# Patient Record
Sex: Male | Born: 2005 | Race: Black or African American | Hispanic: No | Marital: Single | Smoking: Never smoker
Health system: Southern US, Community
[De-identification: ages and names within clinical notes are randomized; demographics above are authoritative.]

## PROBLEM LIST (undated history)

## (undated) HISTORY — PX: NO PAST SURGERIES: SHX2092

---
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
## 2024-01-03 ENCOUNTER — Emergency Department (HOSPITAL_COMMUNITY)

## 2024-01-03 ENCOUNTER — Encounter (HOSPITAL_COMMUNITY): Payer: Self-pay | Admitting: Emergency Medicine

## 2024-01-03 ENCOUNTER — Emergency Department (HOSPITAL_COMMUNITY)
Admission: EM | Admit: 2024-01-03 | Discharge: 2024-01-04 | Disposition: A | Discharge status: Home / Self Care | Attending: Emergency Medicine | Admitting: Emergency Medicine

## 2024-01-03 DIAGNOSIS — K429 Umbilical hernia without obstruction or gangrene: Secondary | ICD-10-CM | POA: Insufficient documentation

## 2024-01-03 DIAGNOSIS — Y92410 Unspecified street and highway as the place of occurrence of the external cause: Secondary | ICD-10-CM | POA: Insufficient documentation

## 2024-01-03 DIAGNOSIS — S62633A Displaced fracture of distal phalanx of left middle finger, initial encounter for closed fracture: Secondary | ICD-10-CM | POA: Insufficient documentation

## 2024-01-03 DIAGNOSIS — Z23 Encounter for immunization: Secondary | ICD-10-CM | POA: Diagnosis not present

## 2024-01-03 DIAGNOSIS — R930 Abnormal findings on diagnostic imaging of skull and head, not elsewhere classified: Secondary | ICD-10-CM | POA: Insufficient documentation

## 2024-01-03 DIAGNOSIS — S6992XA Unspecified injury of left wrist, hand and finger(s), initial encounter: Secondary | ICD-10-CM | POA: Diagnosis present

## 2024-01-03 DIAGNOSIS — S50311A Abrasion of right elbow, initial encounter: Secondary | ICD-10-CM | POA: Diagnosis not present

## 2024-01-03 LAB — COMPREHENSIVE METABOLIC PANEL WITH GFR
ALT: 18 U/L (ref 0–44)
AST: 26 U/L (ref 15–41)
Albumin: 4 g/dL (ref 3.5–5.0)
Alkaline Phosphatase: 48 U/L (ref 38–126)
Anion gap: 12 (ref 5–15)
BUN: 8 mg/dL (ref 6–20)
CO2: 22 mmol/L (ref 22–32)
Calcium: 8.8 mg/dL — ABNORMAL LOW (ref 8.9–10.3)
Chloride: 105 mmol/L (ref 98–111)
Creatinine, Ser: 1.1 mg/dL (ref 0.61–1.24)
GFR, Estimated: >60 mL/min (ref >60)
Glucose, Bld: 111 mg/dL — ABNORMAL HIGH (ref 70–99)
Potassium: 3.3 mmol/L — ABNORMAL LOW (ref 3.5–5.1)
Sodium: 139 mmol/L (ref 135–145)
Total Bilirubin: 2.6 mg/dL — ABNORMAL HIGH (ref 0.0–1.2)
Total Protein: 6.7 g/dL (ref 6.5–8.1)

## 2024-01-03 LAB — PROTIME-INR
INR: 1.1 (ref 0.8–1.2)
Prothrombin Time: 14.7 s (ref 11.4–15.2)

## 2024-01-03 LAB — I-STAT CHEM 8, ED
BUN: 7 mg/dL (ref 6–20)
Calcium, Ion: 1.11 mmol/L — ABNORMAL LOW (ref 1.15–1.40)
Chloride: 104 mmol/L (ref 98–111)
Creatinine, Ser: 1.1 mg/dL (ref 0.61–1.24)
Glucose, Bld: 112 mg/dL — ABNORMAL HIGH (ref 70–99)
HCT: 39 % (ref 39.0–52.0)
Hemoglobin: 13.3 g/dL (ref 13.0–17.0)
Potassium: 3.3 mmol/L — ABNORMAL LOW (ref 3.5–5.1)
Sodium: 142 mmol/L (ref 135–145)
TCO2: 22 mmol/L (ref 22–32)

## 2024-01-03 LAB — CBC WITH DIFFERENTIAL/PLATELET
Abs Immature Granulocytes: 0.03 10*3/uL (ref 0.00–0.07)
Basophils Absolute: 0.1 10*3/uL (ref 0.0–0.1)
Basophils Relative: 1 %
Eosinophils Absolute: 0 10*3/uL (ref 0.0–0.5)
Eosinophils Relative: 0 %
HCT: 39.8 % (ref 39.0–52.0)
Hemoglobin: 13.7 g/dL (ref 13.0–17.0)
Immature Granulocytes: 0 %
Lymphocytes Relative: 38 %
Lymphs Abs: 3.2 10*3/uL (ref 0.7–4.0)
MCH: 31.6 pg (ref 26.0–34.0)
MCHC: 34.4 g/dL (ref 30.0–36.0)
MCV: 91.7 fL (ref 80.0–100.0)
Monocytes Absolute: 0.5 10*3/uL (ref 0.1–1.0)
Monocytes Relative: 6 %
Neutro Abs: 4.6 10*3/uL (ref 1.7–7.7)
Neutrophils Relative %: 55 %
Platelets: 218 10*3/uL (ref 150–400)
RBC: 4.34 MIL/uL (ref 4.22–5.81)
RDW: 11.7 % (ref 11.5–15.5)
WBC: 8.4 10*3/uL (ref 4.0–10.5)
nRBC: 0 % (ref 0.0–0.2)

## 2024-01-03 LAB — SAMPLE TO BLOOD BANK

## 2024-01-03 LAB — ETHANOL: Alcohol, Ethyl (B): <15 mg/dL (ref <15)

## 2024-01-03 LAB — I-STAT CG4 LACTIC ACID, ED: Lactic Acid, Venous: 2.9 mmol/L (ref 0.5–1.9)

## 2024-01-03 MED ORDER — KETOROLAC TROMETHAMINE 15 MG/ML IJ SOLN
15.0000 mg | Freq: Once | INTRAMUSCULAR | Status: AC
  Administered 2024-01-03: 15 mg via INTRAVENOUS
  Filled 2024-01-03: qty 1

## 2024-01-03 MED ORDER — MORPHINE SULFATE (PF) 4 MG/ML IV SOLN: 4.0000 mg | Freq: Once | INTRAVENOUS | Status: DC

## 2024-01-03 MED ORDER — LACTATED RINGERS IV BOLUS
1000.0000 mL | Freq: Once | INTRAVENOUS | Status: AC
  Administered 2024-01-03: 1000 mL via INTRAVENOUS

## 2024-01-03 MED ORDER — MORPHINE SULFATE (PF) 4 MG/ML IV SOLN
4.0000 mg | Freq: Once | INTRAVENOUS | Status: AC
  Administered 2024-01-03: 4 mg via INTRAVENOUS
  Filled 2024-01-03: qty 1

## 2024-01-03 MED ORDER — IOHEXOL 350 MG/ML SOLN: 75.0000 mL | Freq: Once | INTRAVENOUS | Status: DC | PRN

## 2024-01-03 MED ORDER — IOHEXOL 350 MG/ML SOLN
75.0000 mL | Freq: Once | INTRAVENOUS | Status: AC | PRN
  Administered 2024-01-03: 75 mL via INTRAVENOUS

## 2024-01-03 MED ORDER — TETANUS-DIPHTH-ACELL PERTUSSIS 5-2.5-18.5 LF-MCG/0.5 IM SUSY
0.5000 mL | PREFILLED_SYRINGE | Freq: Once | INTRAMUSCULAR | Status: AC
  Administered 2024-01-03: 0.5 mL via INTRAMUSCULAR

## 2024-01-03 MED ORDER — CEFAZOLIN SODIUM-DEXTROSE 2-4 GM/100ML-% IV SOLN
2.0000 g | Freq: Once | INTRAVENOUS | Status: AC
  Administered 2024-01-03: 2 g via INTRAVENOUS
  Filled 2024-01-03: qty 100

## 2024-01-03 MED ORDER — CEPHALEXIN 500 MG PO CAPS: 500.0000 mg | ORAL_CAPSULE | Freq: Two times a day (BID) | ORAL | 0 refills | Status: AC

## 2024-01-03 NOTE — Discharge Instructions (Addendum)
 Please call hand surgery clinic tomorrow to schedule an appointment with Dr. Gildardo Alderton. Please keep splint clean and dry. Please wear fracture shoe to assist with toe pain. DO NOT BEAR WEIGHT ON YOUR LEFT HAND. Please take Keflex 2 times a day for the next 7 days Please take tylenol  500mg  every 4 hours. You make take 1O00mg  at a time but DO NOT TAKE more than 4000mg  in a 24hr period. Please take 400mg  Ibuprofen every 4 hours You may take Norco every 4-6 hours as needed for severe pain.  Please note that Norco has 325 mg of Tylenol  in it. Thank you for allowing us  to take care of you today.  We hope you begin feeling better soon.   To-Do:  Please follow-up with your primary doctor within the next 2-3 days. Please return to the Emergency Department or call 911 if you experience chest pain, shortness of breath, severe pain, severe fever, altered mental status, or have any reason to think that you need emergency medical care.  Thank you again.  Hope you feel better soon.  [facility] of Emergency Medicine
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
## 2024-01-03 NOTE — ED Notes (Signed)
 Trauma activated as a level 2
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
## 2024-01-03 NOTE — ED Provider Notes (Signed)
 ATTENDING SUPERVISORY NOTE I have personally viewed the imaging studies performed. I have personally seen and examined the patient, and discussed the plan of care with the resident.  I have reviewed the documentation of the resident and agree.  No diagnosis found.  Ultrasound ED FAST  Date/Time: 01/03/2024 8:18 PM  Performed by: Tonia Chew, MD Authorized by: Tonia Chew, MD  Procedure details:    Indications: blunt abdominal trauma and blunt chest trauma       Assess for:  Intra-abdominal fluid and pericardial effusion    Technique:  Abdominal and cardiac    Images: archived      Abdominal findings:    L kidney:  Visualized   R kidney:  Visualized   Liver:  Visualized    Bladder:  Visualized   Hepatorenal space visualized: identified     Splenorenal space: identified     Rectovesical free fluid: not identified     Splenorenal free fluid: not identified     Hepatorenal space free fluid: not identified   Cardiac findings:    Heart:  Visualized   Wall motion: identified     Pericardial effusion: not identified       Tonia Chew, MD 01/05/24 [phone]

## 2024-01-04 MED ORDER — HYDROCODONE-ACETAMINOPHEN 5-325 MG PO TABS: 2.0000 | ORAL_TABLET | Freq: Four times a day (QID) | ORAL | 0 refills | Status: DC | PRN

## 2024-01-04 NOTE — ED Provider Notes (Signed)
  Physical Exam  BP 122/78   Pulse 65   Temp 98 F (36.7 C) (Oral)   Resp 19   SpO2 100%  Not applicable  Procedures  Procedures  ED Course / MDM   Clinical Course as of 01/04/24 0102  Mon Jan 03, 2024  2108 1. Mildly displaced angulated fracture of the head of the fifth metacarpal. 2. Comminuted displaced fracture of the distal third digit.   [TS]  2108 1. Fractures of the first, second, and fourth digits of the left foot. 2. Avulsion fracture of the posterior process of the talus.   [TS]  Tue Jan 04, 2024  0032 Ulnar gutter, cannot bear weight on L hand, needs reevaluation after splint  [CB]    Clinical Course User Index [CB] Beola Terrall RAMAN, PA-C [TS] Sherrine Birmingham, DO   Medical Decision Making Amount and/or Complexity of Data Reviewed Labs: ordered. Radiology: ordered.  Risk Prescription drug management.   Patient care transferred over from Dr. Birmingham Sherrine.  Awaiting reevaluation after ulnar gutter splint.  However before being able to evaluate or see the patient, patient was discharged.  Unable to evaluate patient due to having been discharged prior to my evaluation.       Beola Terrall RAMAN, PA-C 01/04/24 0103    Jerrol Agent, MD 01/04/24 0200

## 2024-01-04 NOTE — Progress Notes (Signed)
 Orthopedic Tech Progress Note Patient Details:  CHASYN CINQUE 07-28-2005 [id number]  Ortho Devices Type of Ortho Device: Finger splint, Ulna gutter splint, Postop shoe/boot, Crutches Ortho Device/Splint Location: lue, lle Ortho Device/Splint Interventions: Ordered, Application, Adjustment  I spoke with the dr before seeing the pt to get permission to apply a finger splint to the lue 3rd digit and applying the ulna gutter to that finger would put pressure on the wound. They said it would be ok. I applied an lue ulna gutter and finger splint to the 3rd digit, lle post op shoe and crutches. Post Interventions Patient Tolerated: Well Instructions Provided: Care of device, Adjustment of device  Chandra Dorn PARAS 01/04/2024, 1:31 AM

## 2024-01-11 ENCOUNTER — Other Ambulatory Visit (INDEPENDENT_AMBULATORY_CARE_PROVIDER_SITE_OTHER): Payer: Self-pay

## 2024-01-11 ENCOUNTER — Ambulatory Visit (INDEPENDENT_AMBULATORY_CARE_PROVIDER_SITE_OTHER): Payer: Self-pay | Admitting: Orthopedic Surgery

## 2024-01-11 ENCOUNTER — Encounter: Payer: Self-pay | Admitting: Physician Assistant

## 2024-01-11 DIAGNOSIS — S6292XA Unspecified fracture of left wrist and hand, initial encounter for closed fracture: Secondary | ICD-10-CM | POA: Insufficient documentation
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
## 2024-01-12 ENCOUNTER — Encounter (HOSPITAL_BASED_OUTPATIENT_CLINIC_OR_DEPARTMENT_OTHER): Payer: Self-pay | Admitting: Orthopedic Surgery

## 2024-01-12 ENCOUNTER — Other Ambulatory Visit: Payer: Self-pay
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
## 2024-01-13 ENCOUNTER — Telehealth: Payer: Self-pay

## 2024-01-13 ENCOUNTER — Encounter (HOSPITAL_BASED_OUTPATIENT_CLINIC_OR_DEPARTMENT_OTHER): Payer: Self-pay | Admitting: Orthopedic Surgery

## 2024-01-13 ENCOUNTER — Ambulatory Visit (HOSPITAL_BASED_OUTPATIENT_CLINIC_OR_DEPARTMENT_OTHER)
Admission: RE | Admit: 2024-01-13 | Discharge: 2024-01-13 | Disposition: A | Discharge status: Home / Self Care | Payer: Self-pay | Attending: Orthopedic Surgery | Admitting: Orthopedic Surgery

## 2024-01-13 ENCOUNTER — Ambulatory Visit (HOSPITAL_BASED_OUTPATIENT_CLINIC_OR_DEPARTMENT_OTHER): Payer: Self-pay | Admitting: Anesthesiology

## 2024-01-13 ENCOUNTER — Encounter (HOSPITAL_BASED_OUTPATIENT_CLINIC_OR_DEPARTMENT_OTHER): Payer: Self-pay | Admitting: Anesthesiology

## 2024-01-13 ENCOUNTER — Encounter (HOSPITAL_BASED_OUTPATIENT_CLINIC_OR_DEPARTMENT_OTHER)
Admission: RE | Disposition: A | Discharge status: Home / Self Care | Payer: Self-pay | Source: Home / Self Care | Attending: Orthopedic Surgery

## 2024-01-13 ENCOUNTER — Ambulatory Visit (HOSPITAL_BASED_OUTPATIENT_CLINIC_OR_DEPARTMENT_OTHER): Payer: Self-pay

## 2024-01-13 DIAGNOSIS — F1290 Cannabis use, unspecified, uncomplicated: Secondary | ICD-10-CM | POA: Insufficient documentation

## 2024-01-13 DIAGNOSIS — S62633A Displaced fracture of distal phalanx of left middle finger, initial encounter for closed fracture: Secondary | ICD-10-CM | POA: Insufficient documentation

## 2024-01-13 DIAGNOSIS — S62633B Displaced fracture of distal phalanx of left middle finger, initial encounter for open fracture: Secondary | ICD-10-CM

## 2024-01-13 DIAGNOSIS — S62337A Displaced fracture of neck of fifth metacarpal bone, left hand, initial encounter for closed fracture: Secondary | ICD-10-CM | POA: Insufficient documentation

## 2024-01-13 DIAGNOSIS — Z87891 Personal history of nicotine dependence: Secondary | ICD-10-CM | POA: Insufficient documentation

## 2024-01-13 HISTORY — PX: OPEN REDUCTION INTERNAL FIXATION (ORIF) DISTAL PHALANX: SHX6236

## 2024-01-13 SURGERY — OPEN REDUCTION INTERNAL FIXATION (ORIF) DISTAL PHALANX
Anesthesia: Monitor Anesthesia Care | Site: Hand | Laterality: Left

## 2024-01-13 MED ORDER — KETOROLAC TROMETHAMINE 30 MG/ML IJ SOLN
INTRAMUSCULAR | Status: DC | PRN
  Administered 2024-01-13: 30 mg via INTRAVENOUS

## 2024-01-13 MED ORDER — KETOROLAC TROMETHAMINE 30 MG/ML IJ SOLN
INTRAMUSCULAR | Status: AC
  Filled 2024-01-13: qty 3

## 2024-01-13 MED ORDER — FENTANYL CITRATE (PF) 100 MCG/2ML IJ SOLN: 25.0000 ug | INTRAMUSCULAR | Status: DC | PRN

## 2024-01-13 MED ORDER — CEFAZOLIN SODIUM-DEXTROSE 2-4 GM/100ML-% IV SOLN
INTRAVENOUS | Status: AC
  Filled 2024-01-13: qty 100

## 2024-01-13 MED ORDER — OXYCODONE HCL 5 MG/5ML PO SOLN: 5.0000 mg | Freq: Once | ORAL | Status: AC | PRN

## 2024-01-13 MED ORDER — LIDOCAINE-EPINEPHRINE 1 %-1:100000 IJ SOLN
INTRAMUSCULAR | Status: AC
  Filled 2024-01-13: qty 1

## 2024-01-13 MED ORDER — MIDAZOLAM HCL 2 MG/2ML IJ SOLN
INTRAMUSCULAR | Status: DC | PRN
  Administered 2024-01-13: 2 mg via INTRAVENOUS

## 2024-01-13 MED ORDER — FENTANYL CITRATE (PF) 100 MCG/2ML IJ SOLN
INTRAMUSCULAR | Status: DC | PRN
  Administered 2024-01-13 (×2): 25 ug via INTRAVENOUS
  Administered 2024-01-13: 50 ug via INTRAVENOUS

## 2024-01-13 MED ORDER — LIDOCAINE HCL (PF) 1 % IJ SOLN
INTRAMUSCULAR | Status: AC
  Filled 2024-01-13: qty 5

## 2024-01-13 MED ORDER — LACTATED RINGERS IV SOLN: INTRAVENOUS | Status: DC

## 2024-01-13 MED ORDER — DEXMEDETOMIDINE HCL IN NACL 80 MCG/20ML IV SOLN
INTRAVENOUS | Status: DC | PRN
  Administered 2024-01-13 (×4): 4 ug via INTRAVENOUS

## 2024-01-13 MED ORDER — DROPERIDOL 2.5 MG/ML IJ SOLN: 0.6250 mg | Freq: Once | INTRAMUSCULAR | Status: DC | PRN

## 2024-01-13 MED ORDER — CEFAZOLIN SODIUM-DEXTROSE 2-4 GM/100ML-% IV SOLN
2.0000 g | INTRAVENOUS | Status: AC
  Administered 2024-01-13: 2 g via INTRAVENOUS

## 2024-01-13 MED ORDER — ONDANSETRON HCL 4 MG/2ML IJ SOLN
INTRAMUSCULAR | Status: DC | PRN
  Administered 2024-01-13: 4 mg via INTRAVENOUS

## 2024-01-13 MED ORDER — MIDAZOLAM HCL 2 MG/2ML IJ SOLN
INTRAMUSCULAR | Status: AC
  Filled 2024-01-13: qty 2

## 2024-01-13 MED ORDER — ACETAMINOPHEN 500 MG PO TABS
ORAL_TABLET | ORAL | Status: AC
  Filled 2024-01-13: qty 2

## 2024-01-13 MED ORDER — 0.9 % SODIUM CHLORIDE (POUR BTL) OPTIME
TOPICAL | Status: DC | PRN
  Administered 2024-01-13: 2000 mL

## 2024-01-13 MED ORDER — PROPOFOL 500 MG/50ML IV EMUL
INTRAVENOUS | Status: DC | PRN
Start: 2024-01-13 — End: 2024-01-13
  Administered 2024-01-13: 150 ug/kg/min via INTRAVENOUS

## 2024-01-13 MED ORDER — ROPIVACAINE HCL 5 MG/ML IJ SOLN
INTRAMUSCULAR | Status: AC
  Filled 2024-01-13: qty 30

## 2024-01-13 MED ORDER — ACETAMINOPHEN 500 MG PO TABS
1000.0000 mg | ORAL_TABLET | Freq: Once | ORAL | Status: AC
  Administered 2024-01-13: 1000 mg via ORAL

## 2024-01-13 MED ORDER — FENTANYL CITRATE (PF) 100 MCG/2ML IJ SOLN
INTRAMUSCULAR | Status: AC
  Filled 2024-01-13: qty 2

## 2024-01-13 MED ORDER — LIDOCAINE HCL 1 % IJ SOLN
INTRAMUSCULAR | Status: DC | PRN
Start: 2024-01-13 — End: 2024-01-13
  Administered 2024-01-13: 10 mL

## 2024-01-13 MED ORDER — LIDOCAINE HCL (CARDIAC) PF 100 MG/5ML IV SOSY
PREFILLED_SYRINGE | INTRAVENOUS | Status: DC | PRN
  Administered 2024-01-13: 40 mg via INTRAVENOUS

## 2024-01-13 MED ORDER — OXYCODONE HCL 5 MG PO TABS
5.0000 mg | ORAL_TABLET | Freq: Once | ORAL | Status: AC | PRN
  Administered 2024-01-13: 5 mg via ORAL

## 2024-01-13 MED ORDER — OXYCODONE HCL 5 MG PO TABS: 5.0000 mg | ORAL_TABLET | Freq: Four times a day (QID) | ORAL | 0 refills | Status: AC | PRN

## 2024-01-13 MED ORDER — OXYCODONE HCL 5 MG PO TABS
ORAL_TABLET | ORAL | Status: AC
  Filled 2024-01-13: qty 1

## 2024-01-13 SURGICAL SUPPLY — 49 items
BLADE SURG 15 STRL LF DISP TIS (BLADE) ×4 IMPLANT
BNDG COHESIVE 4X5 TAN STRL LF (GAUZE/BANDAGES/DRESSINGS) ×2 IMPLANT
BNDG COMPR ESMARK 4X3 LF (GAUZE/BANDAGES/DRESSINGS) ×2 IMPLANT
BNDG ELASTIC 4INX 5YD STR LF (GAUZE/BANDAGES/DRESSINGS) ×4 IMPLANT
BNDG GAUZE DERMACEA FLUFF 4 (GAUZE/BANDAGES/DRESSINGS) ×2 IMPLANT
CAP PIN ORTHO PINK (CAP) IMPLANT
CAP PIN PROTECTOR ORTHO WHT (CAP) IMPLANT
CHLORAPREP W/TINT 26 (MISCELLANEOUS) ×2 IMPLANT
CORD BIPOLAR FORCEPS 12FT (ELECTRODE) ×2 IMPLANT
COVER BACK TABLE 60X90IN (DRAPES) ×2 IMPLANT
CUFF TOURN SGL QUICK 18X4 (TOURNIQUET CUFF) IMPLANT
CUFF TRNQT CYL 24X4X16.5-23 (TOURNIQUET CUFF) ×2 IMPLANT
DRAPE HAND 77X146 (DRAPES) ×2 IMPLANT
DRAPE OEC MINIVIEW 54X84 (DRAPES) ×2 IMPLANT
DRAPE SURG 17X23 STRL (DRAPES) ×2 IMPLANT
DRSG TEGADERM 2-3/8X2-3/4 SM (GAUZE/BANDAGES/DRESSINGS) IMPLANT
GAUZE SPONGE 4X4 12PLY STRL (GAUZE/BANDAGES/DRESSINGS) ×2 IMPLANT
GAUZE SPONGE 4X4 12PLY STRL LF (GAUZE/BANDAGES/DRESSINGS) IMPLANT
GAUZE XEROFORM 1X8 LF (GAUZE/BANDAGES/DRESSINGS) ×2 IMPLANT
GLOVE BIO SURGEON STRL SZ7.5 (GLOVE) ×4 IMPLANT
GLOVE BIOGEL PI IND STRL 7.5 (GLOVE) ×2 IMPLANT
GOWN STRL REUS W/ TWL LRG LVL3 (GOWN DISPOSABLE) ×2 IMPLANT
GOWN STRL REUS W/TWL XL LVL3 (GOWN DISPOSABLE) ×2 IMPLANT
GOWN STRL SURGICAL XL XLNG (GOWN DISPOSABLE) ×4 IMPLANT
KWIRE DBL .054X4 NSTRL (WIRE) IMPLANT
KWIRE DBL TROCAR .045X4 (WIRE) IMPLANT
MANIFOLD NEPTUNE II (INSTRUMENTS) ×2 IMPLANT
NDL HYPO 25X5/8 SAFETYGLIDE (NEEDLE) IMPLANT
NEEDLE HYPO 25X5/8 SAFETYGLIDE (NEEDLE) ×1 IMPLANT
NS IRRIG 1000ML POUR BTL (IV SOLUTION) IMPLANT
PACK BASIN DAY SURGERY FS (CUSTOM PROCEDURE TRAY) ×2 IMPLANT
PAD CAST 4YDX4 CTTN HI CHSV (CAST SUPPLIES) ×4 IMPLANT
PIN GUARD 1.57MM GREEN STERILE (PIN) IMPLANT
SHEET MEDIUM DRAPE 40X70 STRL (DRAPES) ×2 IMPLANT
SLEEVE SCD COMPRESS KNEE MED (STOCKING) IMPLANT
SLING ARM FOAM STRAP LRG (SOFTGOODS) IMPLANT
SPIKE FLUID TRANSFER (MISCELLANEOUS) IMPLANT
SPLINT PLASTER CAST XFAST 4X15 (CAST SUPPLIES) ×20 IMPLANT
STOCKINETTE IMPERVIOUS 9X36 MD (GAUZE/BANDAGES/DRESSINGS) ×2 IMPLANT
SUCTION TUBE FRAZIER 10FR DISP (SUCTIONS) IMPLANT
SUT ETHILON 4 0 PS 2 18 (SUTURE) ×2 IMPLANT
SUT MNCRL AB 3-0 PS2 27 (SUTURE) ×2 IMPLANT
SUT VIC AB 3-0 FS2 27 (SUTURE) IMPLANT
SUT VIC AB 4-0 PS2 18 (SUTURE) IMPLANT
SYR BULB EAR ULCER 3OZ GRN STR (SYRINGE) ×4 IMPLANT
SYR CONTROL 10ML LL (SYRINGE) IMPLANT
TOWEL GREEN STERILE FF (TOWEL DISPOSABLE) ×4 IMPLANT
TUBE CONNECTING 20X1/4 (TUBING) IMPLANT
UNDERPAD 30X36 HEAVY ABSORB (UNDERPADS AND DIAPERS) ×2 IMPLANT

## 2024-01-13 NOTE — Anesthesia Postprocedure Evaluation (Signed)
 Anesthesia Post Note  Patient: Lee Davis  Procedure(s) Performed: Closed REDUCTION percutaneous pinning DISTAL PHALANX, left longfinger nail bed repair. (Left: Hand)     Patient location during evaluation: PACU Anesthesia Type: MAC Level of consciousness: awake and alert Pain management: pain level controlled Vital Signs Assessment: post-procedure vital signs reviewed and stable Respiratory status: spontaneous breathing, nonlabored ventilation and respiratory function stable Cardiovascular status: blood pressure returned to baseline Postop Assessment: no apparent nausea or vomiting Anesthetic complications: no   No notable events documented.  Last Vitals:  Vitals:   01/13/24 0900 01/13/24 0915  BP: 104/71 108/79  Pulse: (!) 51 (!) 52  Resp: 11 14  Temp:    SpO2: 100% 99%    Last Pain:  Vitals:   01/13/24 0934  TempSrc:   PainSc: 4                  Vertell Row

## 2024-01-13 NOTE — Discharge Instructions (Addendum)
    Hand Surgery Postop Instructions   Dressings: Maintain postoperative dressing until orthopedic follow-up.  Keep operative site clean and dry until orthopedic follow-up.  Wound Care: Keep your hand elevated above the level of your heart.  Do not allow it to dangle by your side. Moving your fingers is advised to stimulate circulation but will depend on the site of your surgery.  If you have a splint applied, your doctor will advise you regarding movement.  Activity: Do not drive or operate machinery until clearance given from physician. No heavy lifting with operative extremity.  Diet:  Drink liquids today or eat a light diet.  You may resume a regular diet tomorrow.    General expectations: Take prescribed medication if given, transition to over-the-counter medication as quickly as possible. Fingers may become slightly swollen.  Call your doctor if any of the following occur: Severe pain not relieved by pain medication. Elevated temperature. Dressing soaked with blood. Inability to move fingers. White or bluish color to fingers.   Per [facility] clinic policy, our goal is ensure optimal postoperative pain control with a multimodal pain management strategy. For all [facility] patients, our goal is to wean post-operative narcotic medications by 6 weeks post-operatively. If this is not possible due to utilization of pain medication prior to surgery, your [facility] doctor will support your acute post-operative pain control for the first 6 weeks postoperatively, with a plan to transition you back to your primary pain team following that. Lee Davis will work to ensure a Therapist, occupational.  Lee Davis, M.D. Hand Surgery [facility]  No Tylenol  before 12:30pm today No Ibuprofen/NSAIDS before 3:30pm today.   Post Anesthesia Home Care Instructions  Activity: Get plenty of rest for the remainder of the day. A responsible individual must stay with you for 24 hours  following the procedure.  For the next 24 hours, DO NOT: -Drive a car -Advertising copywriter -Drink alcoholic beverages -Take any medication unless instructed by your physician -Make any legal decisions or sign important papers.  Meals: Start with liquid foods such as gelatin or soup. Progress to regular foods as tolerated. Avoid greasy, spicy, heavy foods. If nausea and/or vomiting occur, drink only clear liquids until the nausea and/or vomiting subsides. Call your physician if vomiting continues.  Special Instructions/Symptoms: Your throat may feel dry or sore from the anesthesia or the breathing tube placed in your throat during surgery. If this causes discomfort, gargle with warm salt water. The discomfort should disappear within 24 hours.  If you had a scopolamine patch placed behind your ear for the management of post- operative nausea and/or vomiting:  1. The medication in the patch is effective for 72 hours, after which it should be removed.  Wrap patch in a tissue and discard in the trash. Wash hands thoroughly with soap and water. 2. You may remove the patch earlier than 72 hours if you experience unpleasant side effects which may include dry mouth, dizziness or visual disturbances. 3. Avoid touching the patch. Wash your hands with soap and water after contact with the patch.

## 2024-01-13 NOTE — Addendum Note (Signed)
 Addendum  created 01/13/24 0946 by Donnell Berwyn SQUIBB, CRNA   Flowsheet accepted

## 2024-01-13 NOTE — Op Note (Signed)
 NAME: Lee Davis MEDICAL RECORD NO: [id number] DATE OF BIRTH: [birth year] FACILITY: [facility] LOCATION:  SURGERY CENTER PHYSICIAN: GILDARDO ALDERTON, MD   OPERATIVE REPORT   DATE OF PROCEDURE: 01/13/24    PREOPERATIVE DIAGNOSIS:  Left long finger distal phalanx fracture with possible nailbed injury, small finger metacarpal neck fracture   POSTOPERATIVE DIAGNOSIS:  Left long finger distal phalanx fracture without significant nailbed injury, small finger metacarpal neck fracture   PROCEDURE: Left long finger irrigation and debridement with open reduction and pinning of distal phalanx fracture Closed treatment small finger metacarpal neck fracture   SURGEON:  GILDARDO ALDERTON, M.D.   ASSISTANT: Almeda Rummer PA   ANESTHESIA:  Local with sedation   INTRAVENOUS FLUIDS:  Per anesthesia flow sheet.   ESTIMATED BLOOD LOSS:  Minimal.   COMPLICATIONS:  None.   SPECIMENS:  none   TOURNIQUET TIME:    Total Tourniquet Time Documented: Upper Arm (Left) - 23 minutes Total: Upper Arm (Left) - 23 minutes    DISPOSITION:  Stable to PACU.   INDICATIONS:  18 year old male who sustained long finger distal phalanx fracture which has notable comminution and displacement.  The wounds in this region are well-healed, there is no exposed bone in this region currently.  However, I explained to him that given the notable comminution and displacement of the distal phalanx fracture, this may preclude his ability in order to heal this fracture nonsurgically.  I explained that if the distal phalanx does not consolidate appropriately, this will create instability moving forward and ongoing pain in this region particular with pinch or grip exercises.  As for the small finger metacarpal neck fracture, this can be treated nonoperatively.   For the long finger, he is indicated for irrigation debridement as well as open versus closed reduction and pinning of the distal phalanx fracture.  Explained that  should the fracture not be fully consolidated after pinning, there would be risk of potential revision amputation in the future.  He also has experienced some underlying nailbed trauma evidenced by the underlying subungual hematoma of the long finger, explained that as part of the surgical intervention, we could likely perform nail plate removal and possible nailbed repair.   Risks and benefits of the procedure were discussed, risks including but not limited to infection, bleeding, scarring, stiffness, nerve injury, tendon injury, vascular injury, hardware complication, persistent nail deformity, lack of nail regrowth, recurrence of symptoms and need for subsequent operation.  We also discussed the appropriate postoperative protocol and timeframe for return to activities and function.  Patient expressed understanding.      OPERATIVE COURSE: Patient was seen and identified in the preoperative area and marked appropriately.  Surgical consent had been signed. Preoperative IV antibiotic prophylaxis was given. He was transferred to the operating room and placed in supine position with the Left upper extremity on an arm board.  Sedation was induced by the anesthesiologist.  10 cc of 1% lidocaine  plain was utilized for digital block purposes of the long finger.  Left upper extremity was prepped and draped in normal sterile orthopedic fashion.  A surgical pause was performed between the surgeons, anesthesia, and operating room staff and all were in agreement as to the patient, procedure, and site of procedure.  Tourniquet was placed and padded appropriately to the left upper arm.  The arm was examined and the tourniquet inflated to 250 mmHg.  We first began with irrigation and debridement of the long finger.  Prior wounds of the distal  phalanx were gently debrided.  There was notable eschar over the volar aspect of the long finger distal phalanx, wound measured approximately 2 centimeters which was elevated to expose  underlying bone.  Copious irrigation was performed.  No frank purulence was noted.  At this juncture, fixation of the distal phalanx was performed.  Point-to-point reduction clamp was utilized to correct the volarly displaced segment of bone.  0.054 K wire was then introduced in retrograde fashion in order to stabilize the volar piece of the distal phalanx, wire was anchored to the middle phalanx in a slightly flexed posture at the DIP.  Additional 0.054 K wire was then introduced in retrograde fashion across the distal phalanx into the middle phalanx for additional stability of the distal phalange fracture.  Biplanar fluoroscopy was utilized to confirm appropriate fracture reduction wire placement.  Wires were then bent and cut appropriately, pinned caps were utilized.  We then turned attention to the nailbed.  Nail plate remained well anchored beneath the eponychial fold.  Loose closure of the volar wound of the distal pulpwas then performed utilizing 3-0 Monocryl.  We then turned our attention to the small finger metacarpal neck fracture.  Biplanar fluoroscopy was then used to confirm no interval displacement of the metacarpal neck fracture.  This remained within acceptable angulation for nonoperative treatment.  Decision was made to place splint and treat this fracture in closed manner.  Tourniquet was subsequently deflated.  Copious irrigation were once again performed  Clamshell splint was then designed utilizing plaster with the hand in the intrinsic plus position.  Splint was extended to protect the distal aspect of the long finger as well as the pin sites.  Sterile dressings were applied over the pin sites and wounds.  Fingertips were pink with brisk capillary refill after deflation of tourniquet.  The operative drapes were broken down.  The patient was awoken from anesthesia safely and taken to PACU in stable condition.   Post-operative plan: The patient will recover in the post-anesthesia care  unit and then be discharged home.  The patient will be non weight bearing on the left upper extremity in a clamshell splint.  I will see the patient back in the office in 1 week for postoperative followup.  Discharge instructions were provided for wound care, dressing maintenance and pain control.  Navon Kotowski, MD Electronically signed, 01/13/24

## 2024-01-13 NOTE — Telephone Encounter (Signed)
 Lvm for pt to cb to schedule 1 week f/u with Dr Erwin

## 2024-01-13 NOTE — Telephone Encounter (Signed)
 Left another message.

## 2024-01-13 NOTE — Interval H&P Note (Signed)
 History and Physical Interval Note:  01/13/2024 7:28 AM  Lee Davis  has presented today for surgery, with the diagnosis of LEFT LONG FINGER DISTAL PHALANX FRACTURE.  The various methods of treatment have been discussed with the patient and family. After consideration of risks, benefits and other options for treatment, the patient has consented to  Procedure(s) with comments: OPEN REDUCTION INTERNAL FIXATION (ORIF) DISTAL PHALANX (Left) - LEFT LONG FINGER CLOSED VS OPEN REDUCTION AND PINNING OF DISITAL PHALANX, IRRIGATION AND DEBRIDE , POSSIBLE NAIL BED REPAIR as a surgical intervention.  The patient's history has been reviewed, patient examined, no change in status, stable for surgery.  I have reviewed the patient's chart and labs.  Questions were answered to the patient's satisfaction.     [name]

## 2024-01-13 NOTE — Transfer of Care (Signed)
 Immediate Anesthesia Transfer of Care Note  Patient: Lee Davis  Procedure(s) Performed: Closed REDUCTION percutaneous pinning DISTAL PHALANX, left longfinger nail bed repair. (Left: Hand)  Patient Location: PACU  Anesthesia Type:MAC  Level of Consciousness: awake and patient cooperative  Airway & Oxygen Therapy: Patient Spontanous Breathing and Patient connected to face mask oxygen  Post-op Assessment: Report given to RN and Post -op Vital signs reviewed and stable  Post vital signs: Reviewed and stable  Last Vitals:  Vitals Value Taken Time  BP    Temp    Pulse 57 01/13/24 08:53  Resp 12 01/13/24 08:53  SpO2 100 % 01/13/24 08:53  Vitals shown include unfiled device data.  Last Pain:  Vitals:   01/13/24 0635  TempSrc: Temporal  PainSc: 3       Patients Stated Pain Goal: 5 (01/13/24 9364)  Complications: No notable events documented.

## 2024-01-14 ENCOUNTER — Encounter (HOSPITAL_BASED_OUTPATIENT_CLINIC_OR_DEPARTMENT_OTHER): Payer: Self-pay | Admitting: Orthopedic Surgery

## 2024-01-14 NOTE — Telephone Encounter (Signed)
 Added to schedule. Lvm for pt advising him

## 2024-01-14 NOTE — Telephone Encounter (Signed)
 Called and scheduled pt for 1 week. Pt needs to see maryann again for foot. Maryann please advise on this. Your schedule is full

## 2024-01-18 ENCOUNTER — Ambulatory Visit (INDEPENDENT_AMBULATORY_CARE_PROVIDER_SITE_OTHER): Payer: Self-pay | Admitting: Physician Assistant

## 2024-01-18 ENCOUNTER — Encounter: Payer: Self-pay | Admitting: Physician Assistant

## 2024-01-18 DIAGNOSIS — M79672 Pain in left foot: Secondary | ICD-10-CM | POA: Insufficient documentation
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
## 2024-01-20 ENCOUNTER — Other Ambulatory Visit (INDEPENDENT_AMBULATORY_CARE_PROVIDER_SITE_OTHER): Payer: Self-pay

## 2024-01-20 ENCOUNTER — Ambulatory Visit (INDEPENDENT_AMBULATORY_CARE_PROVIDER_SITE_OTHER): Payer: Self-pay | Admitting: Orthopedic Surgery

## 2024-01-20 DIAGNOSIS — S6292XA Unspecified fracture of left wrist and hand, initial encounter for closed fracture: Secondary | ICD-10-CM

## 2024-01-20 NOTE — Progress Notes (Unsigned)
   Lee Davis - 18 y.o. male MRN [id number]  Date of birth: February 23, 2006  Office Visit Note: Visit Date: 01/20/2024 PCP: Patient, No Pcp Per Referred by: No ref. provider found  Subjective:  HPI: Lee Davis is a 18 y.o. male who presents today for follow up 1 week status post LEFT LONG FINGER CLOSED VS OPEN REDUCTION AND PINNING OF DISTAL PHALANX, IRRIGATION AND DEBRIDEMENT.  He is doing well overall, pain is controlled.  Pertinent ROS were reviewed with the patient and found to be negative unless otherwise specified above in HPI.   Assessment & Plan: Visit Diagnoses:  1. Closed left hand fracture, initial encounter     Plan: X-rays were obtained today which show stable appearance of the distal phalanx pinning.  Wounds are well-healing.  Cast will be performed today to protect the pin sites and maintain digits in extension.  Follow-up in approximate 2 weeks for repeat clinical and radiographic check.  Follow-up: No follow-ups on file.   Meds & Orders: No orders of the defined types were placed in this encounter.   Orders Placed This Encounter  Procedures   XR Finger Middle Left     Procedures: No procedures performed       Objective:   Vital Signs: There were no vitals taken for this visit.  Ortho Exam Long finger with pins in place, pin sites clean dry and intact, no erythema or drainage notable, nail plate remains well secured beneath the eponychial fold  Imaging: XR Finger Middle Left Result Date: 01/21/2024 X-rays of the long finger demonstrate stable appearance of the distal phalanx fracture with pin fixation x2    Devine Klingel Estela) Meranda Dechaine, M.D. [facility], Hand Surgery

## 2024-01-26 ENCOUNTER — Encounter: Payer: Self-pay | Admitting: Orthopedic Surgery

## 2024-02-03 ENCOUNTER — Other Ambulatory Visit: Payer: Self-pay

## 2024-02-03 ENCOUNTER — Ambulatory Visit: Payer: Self-pay | Admitting: Orthopedic Surgery

## 2024-02-03 DIAGNOSIS — S6292XA Unspecified fracture of left wrist and hand, initial encounter for closed fracture: Secondary | ICD-10-CM

## 2024-02-03 NOTE — Progress Notes (Unsigned)
   Lee Davis - 18 y.o. male MRN [id number]  Date of birth: Oct 03, 2005  Office Visit Note: Visit Date: 02/03/2024 PCP: Patient, No Pcp Per Referred by: No ref. provider found  Subjective:  HPI: Lee Davis is a 18 y.o. male who presents today for follow up 3 weeks status post LEFT LONG FINGER REDUCTION AND PINNING OF DISTAL PHALANX, IRRIGATION AND DEBRIDEMENT.  Doing well overall, pain is controlled.  Pertinent ROS were reviewed with the patient and found to be negative unless otherwise specified above in HPI.   Assessment & Plan: Visit Diagnoses:  1. Closed left hand fracture, initial encounter     Plan: He is doing well postoperatively.  He will be fitted for a finger splint today, can do daily dressing changes.  Volar pin was removed today, pin stabilizing the distal phalanx and DIP will be removed at his subsequent visit in 1 week.  OT referral will be placed in order to begin digital range of motion of the hand.  Remain nonweightbearing.  Follow-up: No follow-ups on file.   Meds & Orders: No orders of the defined types were placed in this encounter.   Orders Placed This Encounter  Procedures   XR Finger Middle Left     Procedures: No procedures performed       Objective:   Vital Signs: There were no vitals taken for this visit.  Ortho Exam Long finger with pins in place, pin sites clean dry and intact, no erythema or drainage notable, nail plate remains well secured beneath the eponychial fold   Imaging: XR Finger Middle Left Result Date: 02/04/2024 X-rays of the long finger demonstrate stable appearance of the distal phalanx fracture with pin fixation x2     Anshul Estela) Agarwala, M.D. [facility], Hand Surgery

## 2024-02-10 ENCOUNTER — Other Ambulatory Visit (INDEPENDENT_AMBULATORY_CARE_PROVIDER_SITE_OTHER): Payer: Self-pay

## 2024-02-10 ENCOUNTER — Ambulatory Visit (INDEPENDENT_AMBULATORY_CARE_PROVIDER_SITE_OTHER): Payer: Self-pay | Admitting: Orthopedic Surgery

## 2024-02-10 DIAGNOSIS — S6292XA Unspecified fracture of left wrist and hand, initial encounter for closed fracture: Secondary | ICD-10-CM

## 2024-02-10 NOTE — Progress Notes (Signed)
   MAKO PELFREY - 18 y.o. male MRN [id number]  Date of birth: October 05, 2005  Office Visit Note: Visit Date: 02/10/2024 PCP: Patient, No Pcp Per Referred by: No ref. provider found  Subjective:  HPI: Lee Davis is a 18 y.o. male who presents today for follow up 4 status post  LEFT LONG FINGER CLOSED VS OPEN REDUCTION AND PINNING OF DISTAL PHALANX, IRRIGATION AND DEBRIDEMENT.  He is doing well overall.  Pain is well-controlled.  Pertinent ROS were reviewed with the patient and found to be negative unless otherwise specified above in HPI.   Assessment & Plan: Visit Diagnoses:  1. Closed left hand fracture, initial encounter     Plan: Remaining pin was removed today without incident.  He was fitted for a Splint over the distal phalanx.  Continue with daily dressing changes and utilization of the splint.  Can remove splint for range of motion exercises over the next few weeks.  Follow-up in approximately 3 to 4 weeks for repeat clinical and radiographic check.  Refrain from weightbearing.  Work note was provided today to allow him to return with restrictions of utilizing the splint at all time and avoiding heavy lifting.  Follow-up: No follow-ups on file.   Meds & Orders: No orders of the defined types were placed in this encounter.   Orders Placed This Encounter  Procedures   XR Finger Middle Left     Procedures: No procedures performed       Objective:   Vital Signs: There were no vitals taken for this visit.  Ortho Exam Long finger with pins in place, pin site clean dry and intact, pin removed today without incident.  No erythema or drainage notable, nail plate remains well secured beneath the eponychial fold   Imaging: XR Finger Middle Left Result Date: 02/10/2024 X-rays of the long finger demonstrate stable appearance of the distal phalanx fracture with ongoing comminution    Damare Serano Estela) Cherri Yera, M.D. [facility], Hand Surgery

## 2024-02-11 ENCOUNTER — Telehealth: Payer: Self-pay | Admitting: Orthopedic Surgery

## 2024-02-11 NOTE — Telephone Encounter (Signed)
 Patient was here. He would like a note to return to work with no restrictions. Would like to come and pick the note up. His # [phone]

## 2024-02-11 NOTE — Telephone Encounter (Signed)
 Per Dr Erwin has to keep lifting restrictions. I called and advised pt of this and he stated understanding

## 2024-03-01 ENCOUNTER — Encounter: Payer: Self-pay | Admitting: Orthopedic Surgery

## 2024-05-08 ENCOUNTER — Encounter: Payer: Self-pay | Admitting: Radiology
